# Patient Record
Sex: Female | Born: 1955 | Race: Black or African American | Hispanic: No | Marital: Married
Health system: Southern US, Community
[De-identification: ages and names within clinical notes are randomized; demographics above are authoritative.]

---

## 2005-10-01 ENCOUNTER — Ambulatory Visit: Payer: Self-pay | Admitting: Unknown Physician Specialty

## 2007-08-09 ENCOUNTER — Ambulatory Visit: Payer: Self-pay | Admitting: Gastroenterology

## 2007-08-18 ENCOUNTER — Ambulatory Visit: Payer: Self-pay | Admitting: Unknown Physician Specialty

## 2007-09-14 ENCOUNTER — Ambulatory Visit: Payer: Self-pay | Admitting: Surgery

## 2007-09-16 ENCOUNTER — Ambulatory Visit: Payer: Self-pay | Admitting: Surgery

## 2007-09-19 ENCOUNTER — Observation Stay: Payer: Self-pay | Admitting: Surgery

## 2007-09-21 ENCOUNTER — Inpatient Hospital Stay: Payer: Self-pay | Admitting: Surgery

## 2007-10-10 ENCOUNTER — Ambulatory Visit: Payer: Self-pay | Admitting: Internal Medicine

## 2007-11-07 ENCOUNTER — Ambulatory Visit: Payer: Self-pay | Admitting: Internal Medicine

## 2007-11-09 ENCOUNTER — Ambulatory Visit: Payer: Self-pay | Admitting: Internal Medicine

## 2007-12-10 ENCOUNTER — Ambulatory Visit: Payer: Self-pay | Admitting: Internal Medicine

## 2008-01-10 ENCOUNTER — Ambulatory Visit: Payer: Self-pay | Admitting: Internal Medicine

## 2008-02-09 ENCOUNTER — Ambulatory Visit: Payer: Self-pay | Admitting: Internal Medicine

## 2008-02-28 ENCOUNTER — Ambulatory Visit: Payer: Self-pay | Admitting: Internal Medicine

## 2008-03-11 ENCOUNTER — Ambulatory Visit: Payer: Self-pay | Admitting: Internal Medicine

## 2008-04-10 ENCOUNTER — Ambulatory Visit: Payer: Self-pay | Admitting: Internal Medicine

## 2008-05-10 ENCOUNTER — Ambulatory Visit: Payer: Self-pay | Admitting: Family Medicine

## 2008-05-11 ENCOUNTER — Ambulatory Visit: Payer: Self-pay | Admitting: Family Medicine

## 2008-05-11 ENCOUNTER — Ambulatory Visit: Payer: Self-pay | Admitting: Internal Medicine

## 2008-06-11 ENCOUNTER — Ambulatory Visit: Payer: Self-pay | Admitting: Internal Medicine

## 2008-06-11 ENCOUNTER — Ambulatory Visit: Payer: Self-pay | Admitting: Family Medicine

## 2008-07-09 ENCOUNTER — Ambulatory Visit: Payer: Self-pay | Admitting: Internal Medicine

## 2008-08-09 ENCOUNTER — Ambulatory Visit: Payer: Self-pay | Admitting: Internal Medicine

## 2008-08-21 ENCOUNTER — Ambulatory Visit: Payer: Self-pay | Admitting: Internal Medicine

## 2008-09-08 ENCOUNTER — Ambulatory Visit: Payer: Self-pay | Admitting: Internal Medicine

## 2008-11-07 ENCOUNTER — Ambulatory Visit: Payer: Self-pay | Admitting: Family Medicine

## 2009-01-19 ENCOUNTER — Emergency Department: Payer: Self-pay | Admitting: Internal Medicine

## 2009-01-28 ENCOUNTER — Ambulatory Visit: Payer: Self-pay | Admitting: Gastroenterology

## 2009-07-19 ENCOUNTER — Emergency Department: Payer: Self-pay | Admitting: Unknown Physician Specialty

## 2010-01-29 ENCOUNTER — Ambulatory Visit: Payer: Self-pay | Admitting: Unknown Physician Specialty

## 2010-10-29 ENCOUNTER — Ambulatory Visit: Payer: Self-pay | Admitting: Internal Medicine

## 2010-12-11 ENCOUNTER — Ambulatory Visit: Payer: Self-pay | Admitting: Gastroenterology

## 2010-12-15 LAB — PATHOLOGY REPORT

## 2011-02-25 ENCOUNTER — Emergency Department: Payer: Self-pay | Admitting: Unknown Physician Specialty

## 2011-07-29 ENCOUNTER — Ambulatory Visit: Payer: Self-pay | Admitting: Unknown Physician Specialty

## 2011-07-30 ENCOUNTER — Ambulatory Visit: Payer: Self-pay | Admitting: Unknown Physician Specialty

## 2012-02-18 ENCOUNTER — Ambulatory Visit: Payer: Self-pay | Admitting: General Surgery

## 2012-08-10 ENCOUNTER — Other Ambulatory Visit: Payer: Self-pay | Admitting: Podiatry

## 2012-08-10 ENCOUNTER — Inpatient Hospital Stay: Payer: Self-pay | Admitting: Podiatry

## 2012-08-10 LAB — CBC WITH DIFFERENTIAL/PLATELET
Basophil #: 0.1 10*3/uL (ref 0.0–0.1)
Basophil %: 0.8 %
Eosinophil #: 0.3 10*3/uL (ref 0.0–0.7)
Eosinophil %: 2 %
HCT: 30.1 % — ABNORMAL LOW (ref 35.0–47.0)
HGB: 9.7 g/dL — ABNORMAL LOW (ref 12.0–16.0)
Lymphocyte #: 1 10*3/uL (ref 1.0–3.6)
Lymphocyte %: 6.4 %
MCH: 32 pg (ref 26.0–34.0)
MCHC: 32.4 g/dL (ref 32.0–36.0)
MCV: 99 fL (ref 80–100)
Monocyte #: 0.9 x10 3/mm (ref 0.2–0.9)
Monocyte %: 5.9 %
Neutrophil #: 12.6 10*3/uL — ABNORMAL HIGH (ref 1.4–6.5)
Neutrophil %: 84.9 %
Platelet: 267 10*3/uL (ref 150–440)
RBC: 3.04 10*6/uL — ABNORMAL LOW (ref 3.80–5.20)
RDW: 14 % (ref 11.5–14.5)
WBC: 14.9 10*3/uL — ABNORMAL HIGH (ref 3.6–11.0)

## 2012-08-10 LAB — BASIC METABOLIC PANEL
Anion Gap: 12 (ref 7–16)
BUN: 19 mg/dL — ABNORMAL HIGH (ref 7–18)
Calcium, Total: 9.2 mg/dL (ref 8.5–10.1)
Chloride: 95 mmol/L — ABNORMAL LOW (ref 98–107)
Co2: 23 mmol/L (ref 21–32)
Creatinine: 4.26 mg/dL — ABNORMAL HIGH (ref 0.60–1.30)
EGFR (African American): 12 — ABNORMAL LOW
EGFR (Non-African Amer.): 11 — ABNORMAL LOW
Glucose: 367 mg/dL — ABNORMAL HIGH (ref 65–99)
Osmolality: 278 (ref 275–301)
Potassium: 3.9 mmol/L (ref 3.5–5.1)
Sodium: 130 mmol/L — ABNORMAL LOW (ref 136–145)

## 2012-08-11 LAB — CBC WITH DIFFERENTIAL/PLATELET
Basophil %: 1 %
Eosinophil #: 0.5 10*3/uL (ref 0.0–0.7)
Eosinophil %: 4.2 %
Lymphocyte #: 1.5 10*3/uL (ref 1.0–3.6)
Lymphocyte %: 12 %
MCHC: 32.4 g/dL (ref 32.0–36.0)
Monocyte #: 1 x10 3/mm — ABNORMAL HIGH (ref 0.2–0.9)
Neutrophil #: 9.1 10*3/uL — ABNORMAL HIGH (ref 1.4–6.5)
Neutrophil %: 74.3 %
Platelet: 290 10*3/uL (ref 150–440)
RBC: 3.05 10*6/uL — ABNORMAL LOW (ref 3.80–5.20)
RDW: 14.1 % (ref 11.5–14.5)
WBC: 12.3 10*3/uL — ABNORMAL HIGH (ref 3.6–11.0)

## 2012-08-11 LAB — COMPREHENSIVE METABOLIC PANEL
Albumin: 2.7 g/dL — ABNORMAL LOW (ref 3.4–5.0)
Anion Gap: 7 (ref 7–16)
Calcium, Total: 9.5 mg/dL (ref 8.5–10.1)
Chloride: 100 mmol/L (ref 98–107)
Creatinine: 5.89 mg/dL — ABNORMAL HIGH (ref 0.60–1.30)
EGFR (Non-African Amer.): 7 — ABNORMAL LOW
SGPT (ALT): 13 U/L (ref 12–78)
Sodium: 133 mmol/L — ABNORMAL LOW (ref 136–145)

## 2012-08-11 LAB — PHOSPHORUS: Phosphorus: 6.3 mg/dL — ABNORMAL HIGH (ref 2.5–4.9)

## 2012-08-12 LAB — PHOSPHORUS: Phosphorus: 6.8 mg/dL — ABNORMAL HIGH (ref 2.5–4.9)

## 2012-08-12 LAB — CBC WITH DIFFERENTIAL/PLATELET
Basophil #: 0.2 10*3/uL — ABNORMAL HIGH (ref 0.0–0.1)
Basophil %: 1.3 %
Eosinophil %: 3.8 %
HCT: 28.3 % — ABNORMAL LOW (ref 35.0–47.0)
HGB: 9.3 g/dL — ABNORMAL LOW (ref 12.0–16.0)
Lymphocyte #: 1.6 10*3/uL (ref 1.0–3.6)
Lymphocyte %: 10.9 %
MCH: 32.6 pg (ref 26.0–34.0)
MCHC: 32.8 g/dL (ref 32.0–36.0)
MCV: 99 fL (ref 80–100)
Monocyte #: 1.5 x10 3/mm — ABNORMAL HIGH (ref 0.2–0.9)
Neutrophil #: 10.6 10*3/uL — ABNORMAL HIGH (ref 1.4–6.5)
Neutrophil %: 73.8 %
Platelet: 306 10*3/uL (ref 150–440)
WBC: 14.3 10*3/uL — ABNORMAL HIGH (ref 3.6–11.0)

## 2012-08-12 LAB — BASIC METABOLIC PANEL
Calcium, Total: 9.3 mg/dL (ref 8.5–10.1)
Co2: 23 mmol/L (ref 21–32)
Creatinine: 8.54 mg/dL — ABNORMAL HIGH (ref 0.60–1.30)
EGFR (African American): 5 — ABNORMAL LOW
Potassium: 4.9 mmol/L (ref 3.5–5.1)

## 2012-08-13 LAB — CBC WITH DIFFERENTIAL/PLATELET
Basophil #: 0.2 10*3/uL — ABNORMAL HIGH (ref 0.0–0.1)
Basophil %: 1 %
Eosinophil #: 0.5 10*3/uL (ref 0.0–0.7)
Eosinophil %: 3.2 %
HCT: 28.2 % — ABNORMAL LOW (ref 35.0–47.0)
HGB: 9.2 g/dL — ABNORMAL LOW (ref 12.0–16.0)
Lymphocyte #: 1.4 10*3/uL (ref 1.0–3.6)
MCHC: 32.8 g/dL (ref 32.0–36.0)
Monocyte #: 1.8 x10 3/mm — ABNORMAL HIGH (ref 0.2–0.9)
Neutrophil #: 11.4 10*3/uL — ABNORMAL HIGH (ref 1.4–6.5)
Neutrophil %: 75 %
Platelet: 288 10*3/uL (ref 150–440)
RBC: 2.88 10*6/uL — ABNORMAL LOW (ref 3.80–5.20)
WBC: 15.2 10*3/uL — ABNORMAL HIGH (ref 3.6–11.0)

## 2012-08-14 LAB — CBC WITH DIFFERENTIAL/PLATELET
Basophil %: 1.2 %
Eosinophil #: 0.6 10*3/uL (ref 0.0–0.7)
HCT: 27.2 % — ABNORMAL LOW (ref 35.0–47.0)
HGB: 8.9 g/dL — ABNORMAL LOW (ref 12.0–16.0)
MCH: 32.4 pg (ref 26.0–34.0)
MCHC: 32.9 g/dL (ref 32.0–36.0)
Monocyte #: 1.4 x10 3/mm — ABNORMAL HIGH (ref 0.2–0.9)
Platelet: 306 10*3/uL (ref 150–440)
RDW: 13.9 % (ref 11.5–14.5)

## 2012-08-14 LAB — PHOSPHORUS: Phosphorus: 6.5 mg/dL — ABNORMAL HIGH (ref 2.5–4.9)

## 2012-08-15 LAB — CBC WITH DIFFERENTIAL/PLATELET
Basophil #: 0.1 10*3/uL (ref 0.0–0.1)
Eosinophil #: 0.5 10*3/uL (ref 0.0–0.7)
HCT: 27.3 % — ABNORMAL LOW (ref 35.0–47.0)
Lymphocyte #: 1.1 10*3/uL (ref 1.0–3.6)
MCHC: 32.7 g/dL (ref 32.0–36.0)
MCV: 98 fL (ref 80–100)
Monocyte #: 1.2 x10 3/mm — ABNORMAL HIGH (ref 0.2–0.9)
Monocyte %: 9.6 %
Neutrophil %: 75.7 %
RBC: 2.79 10*6/uL — ABNORMAL LOW (ref 3.80–5.20)
WBC: 12 10*3/uL — ABNORMAL HIGH (ref 3.6–11.0)

## 2012-08-15 LAB — PATHOLOGY REPORT

## 2012-08-16 LAB — CBC WITH DIFFERENTIAL/PLATELET
Basophil %: 1 %
Eosinophil #: 0.4 10*3/uL (ref 0.0–0.7)
Eosinophil %: 3.8 %
HCT: 27.3 % — ABNORMAL LOW (ref 35.0–47.0)
HGB: 9 g/dL — ABNORMAL LOW (ref 12.0–16.0)
Lymphocyte #: 0.8 10*3/uL — ABNORMAL LOW (ref 1.0–3.6)
MCHC: 33 g/dL (ref 32.0–36.0)
MCV: 98 fL (ref 80–100)
Monocyte #: 1.1 x10 3/mm — ABNORMAL HIGH (ref 0.2–0.9)
Monocyte %: 11.7 %
Platelet: 322 10*3/uL (ref 150–440)
RBC: 2.79 10*6/uL — ABNORMAL LOW (ref 3.80–5.20)
RDW: 13.9 % (ref 11.5–14.5)

## 2012-08-16 LAB — WOUND CULTURE

## 2012-08-16 LAB — CULTURE, BLOOD (SINGLE)

## 2012-08-18 LAB — MISC AER/ANAEROBIC CULT.

## 2012-08-30 LAB — COMPREHENSIVE METABOLIC PANEL
Albumin: 2.4 g/dL — ABNORMAL LOW (ref 3.4–5.0)
Alkaline Phosphatase: 90 U/L (ref 50–136)
BUN: 35 mg/dL — ABNORMAL HIGH (ref 7–18)
Bilirubin,Total: 0.3 mg/dL (ref 0.2–1.0)
Calcium, Total: 9.4 mg/dL (ref 8.5–10.1)
Co2: 27 mmol/L (ref 21–32)
Creatinine: 8.59 mg/dL — ABNORMAL HIGH (ref 0.60–1.30)
EGFR (African American): 5 — ABNORMAL LOW
EGFR (Non-African Amer.): 5 — ABNORMAL LOW
Glucose: 124 mg/dL — ABNORMAL HIGH (ref 65–99)
Potassium: 4.6 mmol/L (ref 3.5–5.1)
SGPT (ALT): 13 U/L (ref 12–78)

## 2012-08-30 LAB — BASIC METABOLIC PANEL
BUN: 24 mg/dL — ABNORMAL HIGH (ref 7–18)
Calcium, Total: 9.2 mg/dL (ref 8.5–10.1)
Chloride: 96 mmol/L — ABNORMAL LOW (ref 98–107)
EGFR (African American): 8 — ABNORMAL LOW
Glucose: 180 mg/dL — ABNORMAL HIGH (ref 65–99)
Sodium: 132 mmol/L — ABNORMAL LOW (ref 136–145)

## 2012-08-30 LAB — RENAL FUNCTION PANEL
Albumin: 2.4 g/dL — ABNORMAL LOW (ref 3.4–5.0)
Anion Gap: 13 (ref 7–16)
Chloride: 90 mmol/L — ABNORMAL LOW (ref 98–107)
Co2: 25 mmol/L (ref 21–32)
Phosphorus: 7.5 mg/dL — ABNORMAL HIGH (ref 2.5–4.9)
Potassium: 4.8 mmol/L (ref 3.5–5.1)
Sodium: 128 mmol/L — ABNORMAL LOW (ref 136–145)

## 2012-12-07 IMAGING — US ULTRASOUND RIGHT BREAST
1 series · 17 of 25 positions shown · non-contrast
Comparison: none

REASON FOR EXAM: nodule
COMMENTS:

PROCEDURE:     US  - US BREAST RIGHT  - July 30, 2011  [DATE]
RESULT:

[Series 1: ultrasound right breast · 17 of 45 slices shown]
[im 1/45]
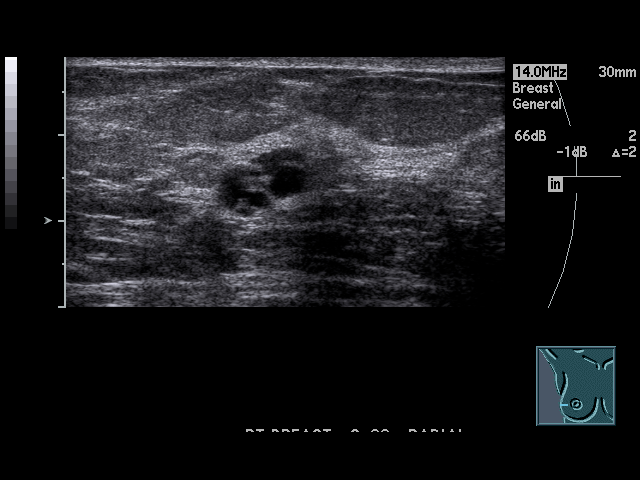
[im 4/45]
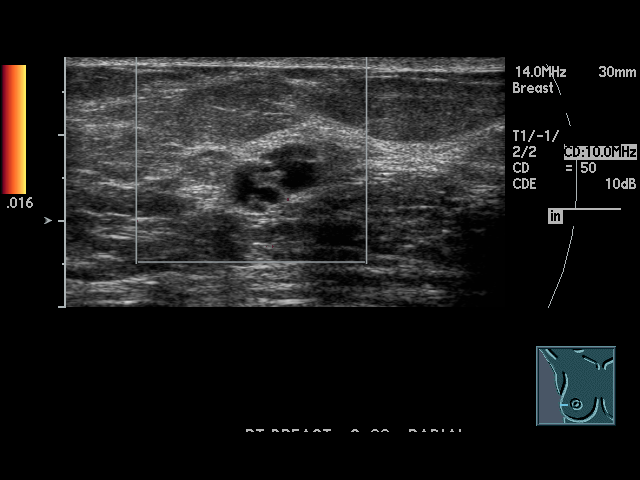
[im 6/45]
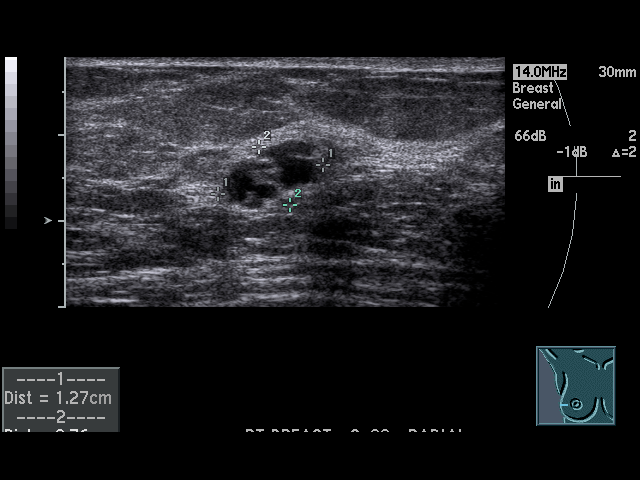
[im 10/45]
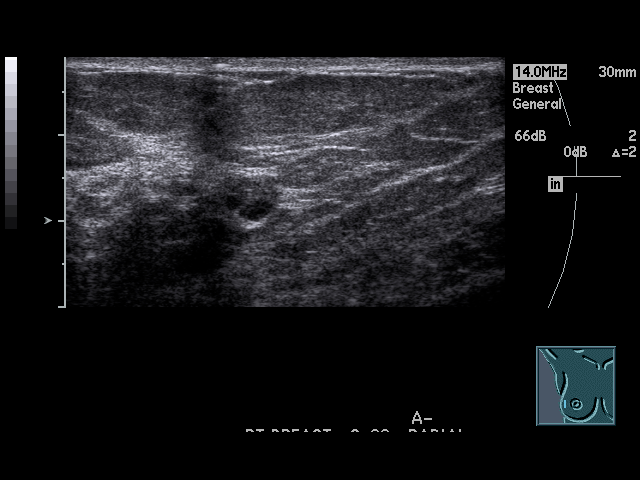
[im 12/45]
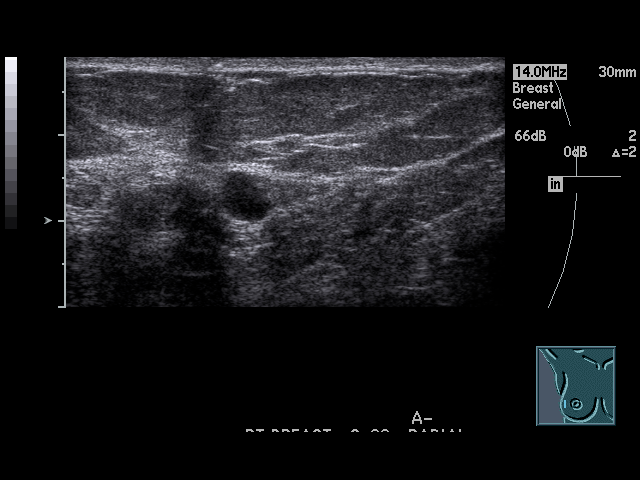
[im 15/45]
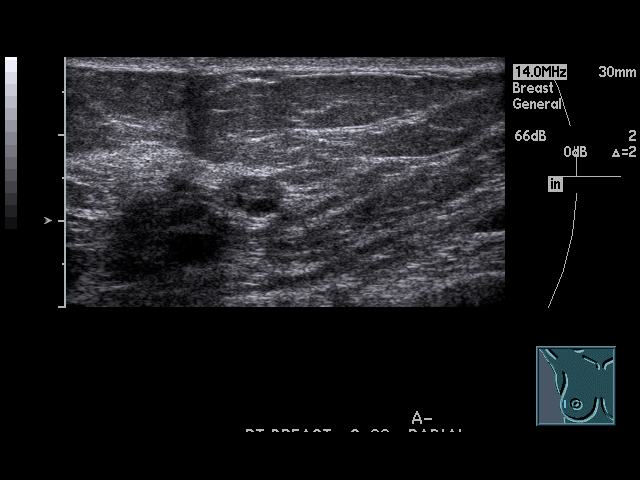
[im 17/45]
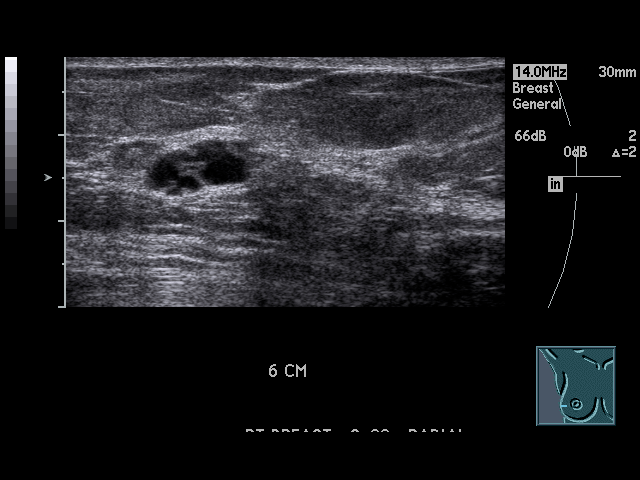
[im 21/45]
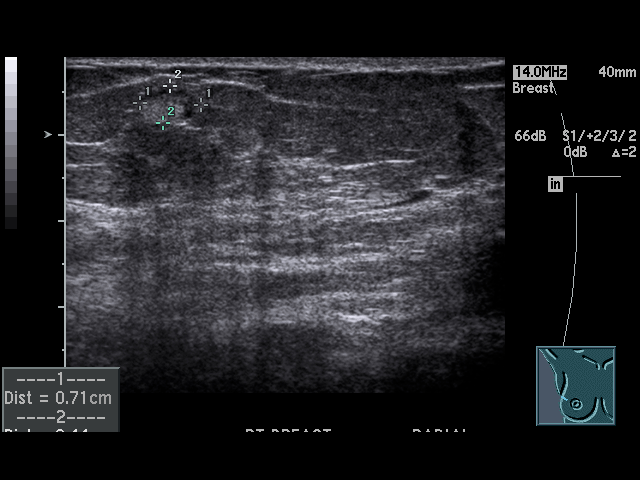
[im 23/45]
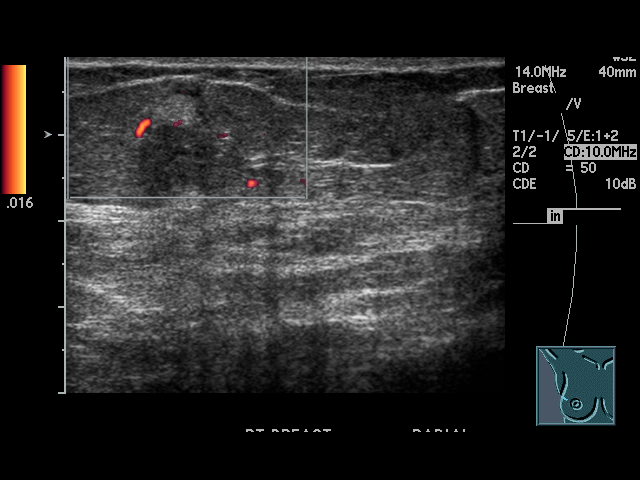
[im 24/45]
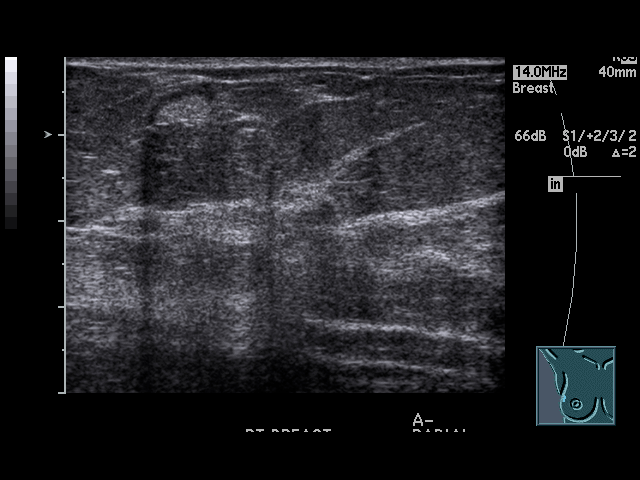
[im 28/45]
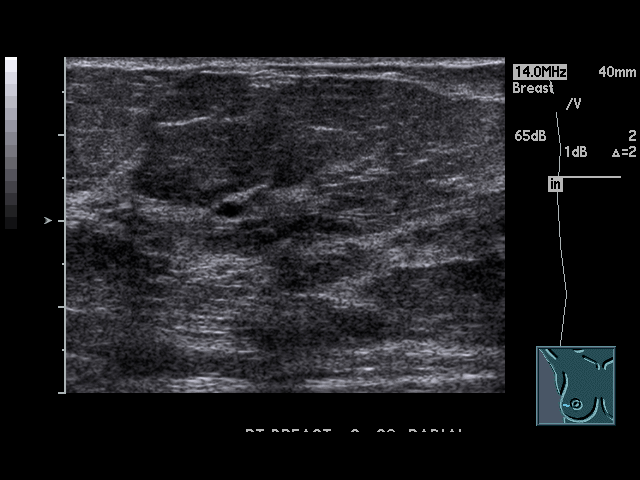
[im 30/45]
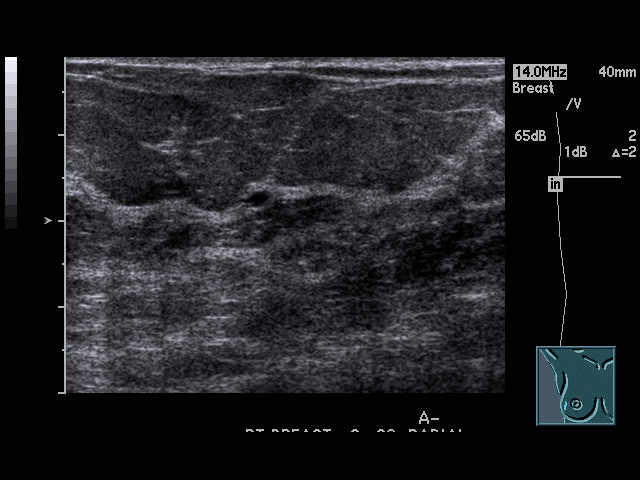
[im 34/45]
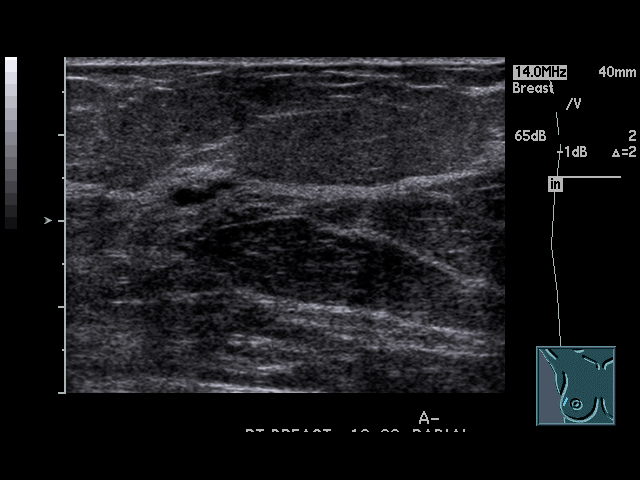
[im 35/45]
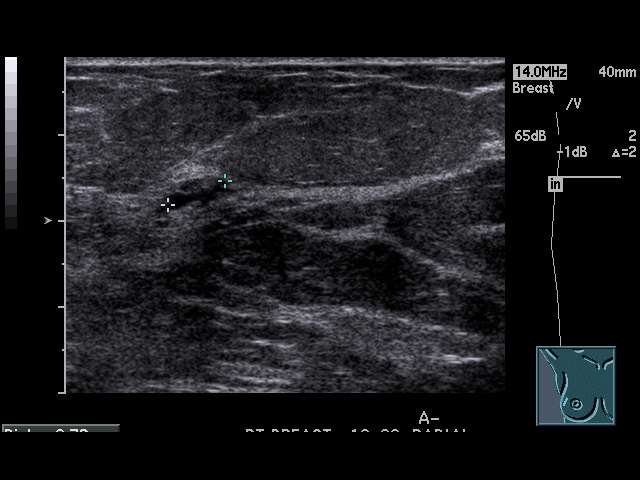
[im 39/45]
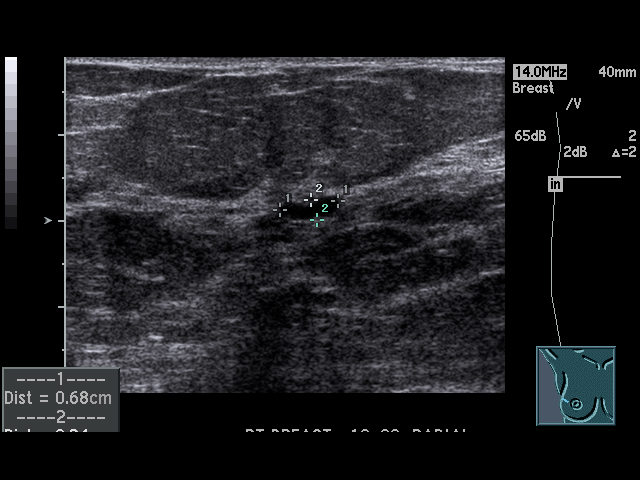
[im 41/45]
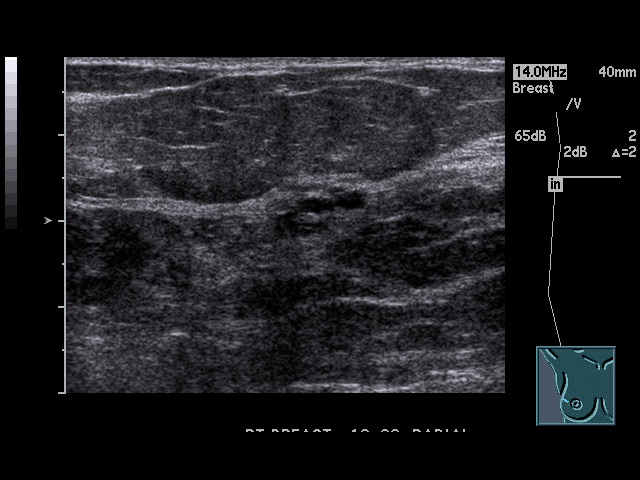
[im 45/45]
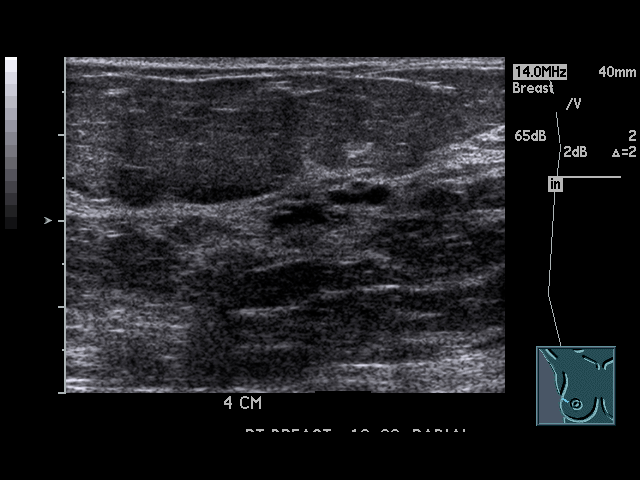

[17 of 25 positions shown; findings below may reference images not displayed]

FINDINGS: The patient returned for additional magnification compression
images of the right breast in the area of concern. The somewhat elliptical
parenchymal density near the 9 to 10 o'clock region of the right breast
posteriorly persists. No density additionally is seen in this area. No
malignant appearing calcifications are evident. Targeted ultrasound in the
region demonstrates multiple cysts versus a septated cyst with an overall
measurement of 1.3 x 0.8 x 0.7 cm. The 10 o'clock area shows a solid
appearing, elliptical structure with increased central echogenicity
suggestive of a lymph node measuring 0.7 x 0.4 x 0.9 cm. The [DATE] region
shows a 0.3 x 0.2 x 0.2 cm cyst. The 10 o'clock region shows multiple cysts
versus septated cysts measuring 0.7 x 0.4 x 0.7 cm.
IMPRESSION: Complex, cystic structure in the 9 o'clock region with a
smaller similar structure in the 10 o'clock region which could represent a
complex, septated cyst with some soft tissue component or adjacent cysts. No
definite malignant features are seen. The area mammographically appears to
be slightly more prominent than anything in this region previously.

BI-RADS: Category 4A - Finding needing intervention with a low suspicion for
malignancy.

RECOMMENDATION:  Six month follow-up view at minimum is recommended if no
surgical intervention is obtained.

## 2012-12-09 DEATH — deceased

## 2014-08-31 NOTE — Consult Note (Signed)
PATIENT NAME:  Lauren Mcintosh, Lauren Mcintosh MR#:  [id number] DATE OF BIRTH:  [birth year]  DATE OF CONSULTATION:  08/10/2012  REQUESTING PHYSICIAN:  Dr. Ether GriffinsFowler   CONSULTING PHYSICIAN:  Giovanni Biby Lizabeth LeydenN. Airyanna Dipalma, MD  REASON FOR CONSULTATION:  Evaluation and management of end-stage renal disease in a hemodialysis patient.   HISTORY OF PRESENT ILLNESS: The patient is a very pleasant 59 year old African American female with past medical history of diabetes mellitus, hypertension, diabetic nephropathy, end-stage renal disease on hemodialysis Monday, Wednesday and Friday, Barrett's esophagus, right-sided colectomy for high-grade tubular adenoma in May 2009, anemia of chronic kidney disease, secondary hyperparathyroidism, diverticulosis, right foot ulceration who presented to [facility] for further care of right diabetic foot ulcer. The patient was followed by Dr. Gwyneth RevelsJustin Fowler for this issue. She will need additional antibiotic and surgical therapy to fully treat these ulcerations. In regards to her underlying endstage renal disease, the patient has been on dialysis for the past 3.5 years. She [facility]. The cause of her renal failure is diabetic nephropathy. She dialyzes on the first shift per her report. She has secondary hyperparathyroidism for which she takes Renvela 4 tablets with meals and 2 tablets with snacks. She also has anemia of chronic kidney disease treated with Epogen, most recent hemoglobin was found to be 9.7. The patient has a left upper extremity fistula, which is functional. She underwent dialysis today and completed her full therapy.   PAST MEDICAL HISTORY: 1.  Diabetes mellitus.  2.  Hypertension.  3.  Diabetic nephropathy.  4. End-stage renal disease on hemodialysis Monday, Wednesday, Friday [facility].  5.  History of Barrett's esophagus.  6.  Right hemicolectomy for high-grade tubular adenoma in May 2009.  7.   Anemia of chronic kidney disease.  8.  Secondary hyperparathyroidism.  9.  Diverticulosis.  10.  Left upper extremity AV fistula.   ALLERGIES:  No known drug allergies.  CURRENT INPATIENT MEDICATIONS:  Include: 1.  Tylox 5/325 one to 2 tablets p.o. q. 4 hours p.r.n. pain.  2.  Sliding scale insulin.  3.  Toprol-XL 50 mg p.o. daily.  4.  Prilosec 20 mg p.o. b.i.d.  5.  Actos 30 mg p.o. daily.  6.  Zosyn 3.375 grams IV q. 12 hours.  7.  Pravastatin 40 mg p.o. at bedtime.  8.  Sevelamer 3200 mg p.o. t.i.d. with meals.   SOCIAL HISTORY:  The patient lives in [place]. She has 1 daughter. She denies tobacco, alcohol or illicit drug use.   FAMILY HISTORY:  The patient's mother is living and has hypertension. The patient's father is deceased and had history of end-stage renal disease requiring hemodialysis.  REVIEW OF SYSTEMS:  CONSTITUTIONAL:  Denies fevers. Reports some intermittent chills.  EYES:  Denies diplopia, blurry vision.  HEENT:  Denies headaches or hearing loss. Denies epistaxis.  CARDIOVASCULAR:  Denies chest pain, palpitations, PND.  RESPIRATORY:  Denies cough, shortness of breath, hemoptysis.  GASTROINTESTINAL:  Denies nausea, vomiting, bloody stools.  GENITOURINARY:  Denies frequency, urgency, dysuria.  MUSCULOSKELETAL:  Reports right foot diabetic foot ulcer.  INTEGUMENTARY:  Denies skin rashes. Reports ulceration in the right foot.  NEUROLOGIC:  Reports numbness and tingling in bilateral feet.  PSYCHIATRIC:  Denies depression, bipolar disorder.  ENDOCRINE: Denies polyuria or polydipsia. Does have history of diabetes mellitus. HEMATOLOGIC AND LYMPHATIC: Denies easy bruisability, bleeding or swollen lymph nodes. Has history of anemia of chronic kidney disease. ALLERGY AND IMMUNOLOGIC:  Denies seasonal allergies or history of immunodeficiency.   PHYSICAL EXAMINATION: VITAL SIGNS: Temperature 99.9, pulse 97, respiration 18, blood pressure 127/74, pulse oximetry 98%  on room air.  GENERAL:  Well-developed, well-nourished African American female who appears her stated age, currently in no acute distress.  HEENT:  Normocephalic, atraumatic. Extraocular movements are intact. Pupils equal, round and reactive to light. No scleral icterus. Conjunctivae are pink. No epistaxis noted. Gross hearing intact. Oral mucosa moist.  NECK:  Supple without JVD or lymphadenopathy.  LUNGS:  Clear to auscultation bilaterally with normal respiratory effort.  HEART:  S1, S2 regular rate and rhythm. No murmurs, rubs or gallops appreciated.  ABDOMEN:  Obese, soft, nontender, nondistended. Bowel sounds positive.  No rebound.  No guarding. No gross organomegaly appreciated.  EXTREMITIES:  No clubbing, cyanosis or edema.  NEUROLOGIC: The patient is alert and oriented to time, person and place. Strength is 5/5 in both upper and lower extremities.  SKIN:  Warm and dry. No rashes noted.  MUSCULOSKELETAL:  The patient's right foot is wrapped. The dressing was not taken down as wounds were evaluated in the right foot by Dr. Ether Griffins today.  PSYCHIATRIC:  The patient has appropriate affect and appears to have good insight into her current illness. HEMODIALYSIS ACCESS:  The patient has a left upper extremity fistula with good bruit and thrill.   LABORATORY DATA:  Sodium 130, potassium 3.9, chloride 95, CO2 23, BUN 19, creatinine 4.26, glucose 367. CBC shows WBC 14.9, hemoglobin 9.7, hematocrit 30, platelets 267.   IMPRESSION:  This is a 59 year old African American female with past medical history of diabetes mellitus, hypertension, diabetic nephropathy, end-stage renal disease on hemodialysis Monday, Wednesday, Friday followed by [facility] at [place], right colectomy for high-grade tubular adenoma May 2009, anemia of chronic disease, secondary hyperparathyroidism, diverticulosis who presented to [facility] with diabetic ulcer in the right foot.    PLAN: 1. End-stage renal disease on hemodialysis Monday, Wednesday, Friday. The patient is followed by [facility] at [facility]. She has been undergoing dialysis quite well. She tolerated her dialysis session today. Therefore, at this point in time, no indication for dialysis tomorrow. We will plan for the next dialysis treatment on Friday. We will continue to use her left upper extremity AV fistula.  2. Right diabetic foot ulcer. The patient is being monitored by Dr. Ether Griffins for this. The patient has been started on Zosyn 3.375 grams IV q. 12 hours. We would also recommend consideration of MRSA coverage. Therefore, vancomycin could be considered; however, I defer this to Dr. Ether Griffins.  3. Secondary hyperparathyroidism.  The patient reports to me that she takes 3200 mg of Renvela t.i.d. with meals. However, she also takes 2400 mg with snacks. We will add this to her medication regimen.  4. Anemia of chronic kidney disease.  The patient's hemoglobin is a bit low at present at 9.7. She may be slightly Epogen resistant given the acute infection now. However, we will continue the patient on Epogen 10,000 units IV every dialysis treatment. We will determine her outpatient Epogen dosing as well. 5. I would like to thank Dr. Ether Griffins for this kind referral. Further plans as the patient progresses.     ____________________________ Lennox Pippins, MD mnl:ce D: 08/10/2012 17:07:09 ET T: 08/10/2012 17:27:08 ET JOB#: [id number]  cc: Lennox Pippins, MD, <Dictator>

## 2014-08-31 NOTE — Consult Note (Signed)
PATIENT NAME:  Lauren Mcintosh, Lauren Mcintosh MR#:  [id number] DATE OF BIRTH:  07-22-1955  DATE OF CONSULTATION:  08/12/2012  REFERRING PHYSICIAN:  Gwyneth Revels, DPM CONSULTING PHYSICIAN:  Rosalyn Gess. Kennidee Heyne, MD  REASON FOR CONSULTATION: Right foot infection.   HISTORY OF PRESENT ILLNESS: The patient is a 59 year old female with a past history significant for diabetes and end-stage renal disease on hemodialysis who was admitted on 04/02 with fairly new onset ulceration with swelling and redness of the right foot. The patient had had ulcers in the right foot in the past but they had healed. Her most recent ulceration was over the past summer. The  patient noted some increased swelling in the right foot, and her mother noticed a draining wound on the bottom of her foot. The patient was seen and evaluated and ultimately admitted. She underwent surgery on 04/03, and the intraoperative note indicates that she had areas of infection in the soft tissue including the sesamoid bone, but the metatarsal bone appeared to be intact. The wound was debrided and antibiotic beads were placed. She tolerated the procedure well. Currently, she is complaining of some pain in the foot mainly on the dorsal aspect. She had not had significant fevers, chills, or sweats prior to her admission. She had some redness and some swelling of the foot but no streaking up her leg. She did not have significant pain in the foot prior to surgery but did have some discomfort that she attributed to the swelling. She is currently on vancomycin and Zosyn. Intraoperative cultures are pending   ALLERGIES: None.   PAST MEDICAL HISTORY: 1. Diabetes.  2. End-stage renal disease on hemodialysis.  3. 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  4. Hypertension.  5. Hypercholesterolemia.  6. Tubular adenoma of the cecum, status post colectomy.  7. Diverticulosis.  8. Gastritis.  9. Esophagitis.  10. Vitamin D deficiency.   SOCIAL HISTORY: The  patient lives with her husband. She does not smoke. She does not drink. She has an outside dog at home.   FAMILY HISTORY: Positive for diabetes and congestive heart failure.   REVIEW OF SYSTEMS:   GENERAL: No fevers, chills, sweats or malaise.  HEENT: No headaches or sinus congestion.  CHEST: No cough. No shortness of breath. No sputum production.  CARDIAC: No chest pains or palpitations.  GI: No nausea. No vomiting. No abdominal pain. No change in her bowels.  GENITOURINARY: She was anuric.   MUSCULOSKELETAL: She had swelling and redness and some discomfort in the right foot.  No other joints have been bothering her.  SKIN: She had the ulceration that has been draining purulent material for several days prior to admission.  NEUROLOGIC: No focal weakness.  PSYCHIATRIC: No complaints.  All other systems are negative.   PHYSICAL EXAMINATION: VITAL SIGNS: T-max of 101.0, T-current of 97.7, pulse of 100, blood pressure 132/75, 91% on room air.  GENERAL: A 59 year old black female in no acute distress.  HEENT: Normocephalic, atraumatic. Pupils are equal and reactive to light. Extraocular motions  intact. Sclerae, conjunctivae and lids appeared to be without evidence for emboli or petechiae.  CHEST: Clear to auscultation bilaterally with good air movement. No focal consolidation.  HEART: Regular rate and rhythm without murmur, rub or gallop.  ABDOMEN: Soft, nontender and nondistended. No hepatosplenomegaly. No hernia is noted.  EXTREMITIES: No evidence for tenosynovitis. The right foot was wrapped in bandages, and the wound was not directly observed. There is no evidence for lymphangitic streaking up the right leg.  She had her hemodialysis access in her left arm.  SKIN: No rashes. No stigmata of endocarditis, specifically, no Janeway lesions or Osler nodes.  NEUROLOGIC: The patient was awake and interactive, moving all 4 extremities.  PSYCHIATRIC: Mood and affect appeared normal.   LABORATORY  DATA: BUN 42, creatinine 8.54, bicarbonate 23, anion gap 11. LFTs were unremarkable. White count from today was 14.3 with a hemoglobin 9.3, platelet count of 306, ANC of 10.6. White count on admission was 14.9. Wound culture from admission, presumably a superficial culture, is growing several gram-negative rods and gram-positive cocci. Blood cultures from admission are negative to date. A wound culture from yesterday that was taken intraoperatively is also growing gram-negative rods and a Gram-positive cocci.   IMPRESSION: A 59 year old female with a past history significant for diabetes and end-stage renal disease on hemodialysis who was admitted with a right foot abscess.   RECOMMENDATIONS: 1. The intraoperative note indicates that the metatarsal bone was intact and without evidence for osteomyelitis. The sesamoid bone was involved and removed, but as this bone is not articulated with the bones, infection of the sesamoid may not have the same issues as the other bones of the foot.  2. I would continue current broad-spectrum antibiotics until the cultures return.  3. We will adjust the antibiotics after the cultures return.  4. We will follow her CBC.   This is a low level infectious disease consult.   Thank you very much for involving me in Ms. Urquilla's care.   ____________________________ Rosalyn GessMichael E. Nailyn Dearinger, MD meb:cb D: 08/12/2012 16:00:02 ET T: 08/12/2012 17:02:55 ET JOB#: [id number]  cc: Rosalyn GessMichael E. Tilford Deaton, MD, <Dictator> Keoni Havey E Jemima Petko MD ELECTRONICALLY SIGNED 08/13/2012 10:30

## 2014-08-31 NOTE — Consult Note (Signed)
PATIENT NAME:  Lauren Mcintosh, Lauren Mcintosh MR#:  [id number] DATE OF BIRTH:  25-Aug-1955  DATE OF CONSULTATION:  08/10/2012  REFERRING PHYSICIAN:  Jill Alexanders A. Ether Griffins, DPM CONSULTING PHYSICIAN:  Duane Lope. Judithann Sheen, MD  REASON FOR CONSULTATION: Medical management of hypertension, diabetes and end-stage renal disease.   HISTORY OF PRESENT ILLNESS: The patient is a 59 year old female with a history of end-stage renal disease on hemodialysis every Monday, Wednesday, Friday, as well as diabetes, hypertension and hyperlipidemia. Admitted with right foot ulceration for surgery tomorrow. The patient has been having progressive pain and ulceration involving the right lower extremity. She was admitted by podiatry. Currently she denies any fevers, chills, nausea, vomiting, chest pain, shortness of breath or palpitations.   PAST MEDICAL HISTORY:  1.  Type 2 diabetes mellitus.  2.  Diabetic nephropathy.  3.  Diabetic retinopathy.  4.  Essential hypertension.  5.  Hyperlipidemia.  6.  End-stage renal disease on hemodialysis.  7.  Anemia of chronic disease.  8.  History of tubular adenomas of the cecum, status post partial colectomy.  9.  Diverticulosis.  10.  History of gastritis.  11.  History of esophagitis.  12.  Vitamin D deficiency.  13.  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   MEDICATIONS:  1.  Actos 30 mg p.o. daily.  2.  Aspirin 81 mg p.o. daily.  3.  Glipizide 5 mg p.o. t.i.d.   4.  Multivitamin 1 p.o. daily.  5.  Pravastatin 40 mg p.o. daily.  6.  Nexium 40 mg p.o. daily.  7.  Renvela 3200 mg p.o. t.i.d.  8.  Rena-Vite 1 p.o. daily.   ALLERGIES: No known drug allergies.   SOCIAL HISTORY: Negative for alcohol or tobacco abuse.   FAMILY HISTORY: Positive for diabetes and congestive heart failure.   REVIEW OF SYSTEMS:    CONSTITUTIONAL: No fever or change in weight.  EYES: No blurred or double vision. No glaucoma.  ENT: No tinnitus or hearing loss. No nasal discharge or bleeding. No difficulty  swallowing.  RESPIRATORY: No cough or wheezing. Denies hemoptysis. No painful respiration.  CARDIOVASCULAR: No chest pain or orthopnea. No palpitations or syncope.  GASTROINTESTINAL: No nausea, vomiting or diarrhea. No abdominal pain. No change in bowel habits.  GENITOURINARY: No dysuria or hematuria. No incontinence.  ENDOCRINE: No polyuria or polydipsia. No heat or cold intolerance.  HEMATOLOGIC: The patient denies bleeding or bruising. Does have chronic anemia.  LYMPHATIC: No swollen glands.  MUSCULOSKELETAL: The patient denies pain in her neck, back, shoulders, knees or hips. No gout.  NEUROLOGIC: The patient does have numbness and tingling of her hands and feet. Denies weakness. No migraines, stroke or seizures.  PSYCHIATRIC: The patient denies anxiety, insomnia or depression.   PHYSICAL EXAMINATION:  GENERAL: The patient is in no acute distress.  VITAL SIGNS: Remarkable for a blood pressure of 127/74 with a heart rate of 97 and a temperature of 99.9 with a respiratory rate of 18. Sats are 98% on room air.  HEENT: Normocephalic, atraumatic. Pupils equally round and reactive to light and accommodation. Extraocular movements are intact. Sclerae are anicteric. Conjunctivae are clear. Oropharynx is clear.  NECK: Supple without JVD. No adenopathy or thyromegaly is noted.  LUNGS: Clear to auscultation and percussion without wheezes, rales or rhonchi. No dullness.  CARDIAC: Regular rate and rhythm. Normal S1, S2. No significant rubs, murmurs or gallops. PMI is nondisplaced. Chest wall is nontender.  ABDOMEN: Soft, nontender, with normoactive bowel sounds. No organomegaly or masses were appreciated. No hernias  or bruits were noted.  EXTREMITIES: Trace edema on the right. None on the left. Distal pulses were 1+ bilaterally.  SKIN: Warm and dry without rash. Ulceration of the right foot was noted.  NEUROLOGIC: Cranial nerves II through XII grossly intact. Deep tendon reflexes were symmetric. Motor  exam was nonfocal. Sensory exam revealed a stocking glove neuropathy.  PSYCHIATRIC: Revealed a patient who was alert and oriented to person, place and time. She was cooperative and used good judgment.   ASSESSMENT:  1.  End-stage renal disease on hemodialysis.  2.  Type 2 diabetes.  3.  Diabetic nephropathy.  4.  Diabetic retinopathy.  5.  Diabetic neuropathy.  6.  Hyperlipidemia.  7.  Essential hypertension.  8.  Anemia of chronic disease.  9.  History of colonic polyps, status post partial colectomy.  10.  History of gastritis and esophagitis.   PLAN: The patient states that she was taken off her blood pressure medications. She also states that she is not on insulin. We will hold her glipizide at this time, as she is not eating a normal diet. We will continue her Actos and follow her sugars with Accu-Cheks before meals and at bedtime and add sliding scale insulin as needed. We will begin metoprolol empirically perioperatively and follow her heart rate and blood pressure closely. Agree with nephrology consultation for continuation of hemodialysis. Will obtain baseline labs and follow up labs postoperatively.   Thank you for the consultation. Will continue to follow this patient with you while in the hospital. Please call if questions arise.   ____________________________ Duane LopeJeffrey D. Judithann SheenSparks, MD jds:jm D: 08/10/2012 15:59:31 ET T: 08/10/2012 16:54:51 ET JOB#: [id number]  cc: Duane LopeJeffrey D. Judithann SheenSparks, MD, <Dictator> Shelvie Salsberry Rodena Medin Yittel Emrich MD ELECTRONICALLY SIGNED 08/11/2012 7:54

## 2014-08-31 NOTE — Op Note (Signed)
PATIENT NAME:  Lauren Mcintosh, [place] MR#:  [id number] DATE OF BIRTH:  06-08-1955  DATE OF SURGERY:  08/11/2012  PREOPERATIVE DIAGNOSIS: Right first metatarsophalangeal joint ulcer with infection.   POSTOPERATIVE DIAGNOSIS: Infected right first metatarsophalangeal joint ulcer with abscess.   PROCEDURES: 1. Excisional debridement, plantar diabetic foot ulcer, and excision of sesamoid tarsal bone plantar first metatarsophalangeal joint.  2.  Implantation of Stimulan antibiotic-impregnated beads.   ANESTHESIA: General/LMA.   HEMOSTASIS: None.   COMPLICATIONS: None.   SPECIMENS:  1.  Ulcer, plantar right foot diabetic ulcer.  2.  Sesamoid from diabetic foot ulcer, right foot.   OPERATIVE INDICATIONS: This is a 59 year old female admitted to the hospital with an abscessed draining ulcer from her right foot. She has a plantar ulcer with a medial abscessed area to her first MTPJ that was connected. I discussed surgical intervention and the need for I and D, and the risks, benefits, alternatives, and complications associated with surgery were discussed with the patient and informed consent has been given.   OPERATIVE PROCEDURE: The patient was brought into the OR and placed on the operating table in the supine position. General LMA was administered by the anesthesia team. The right lower extremity was then prepped and draped in the usual sterile fashion. After sterile prep and drape a medial incision was made overlying the abscessed area. Most of the purulent drainage had been drained out from the puncture of the blistered area yesterday. There was some noted deeper necrotic, nonviable tissue to this area that had been destroyed from the infectious process. This wound was then opened up proximal and distal. It was dissected out dorsally and plantarly. There was a connection of a plantar 1 cm ulceration in this area.  The medial aspect was then debrided down to deep capsular tissue with a Versajet. This was an  excisional type of debridement. The plantar ulceration was then debrided with a 15-blade and excised of surrounding soft tissue with a full-thickness debridement down to bone. At this time the plantar first MTPJ had a noted sesamoid area that was connected to the ulcerative site, and I felt this to be a causative agent in allowing this ulceration to stay open. I then excised the sesamoid at this time. This wound was then further debrided and flushed with the Versajet. The plantar aspect of the wound was then packed with multiple antibiotic-impregnated beads. These were impregnated with 1 gram of vancomycin and approximately 0.25 gram of tobramycin. This was placed into the deeper soft tissues plantarly and just proximal to the metatarsal head. The metatarsal head was evaluated. It did not have any obvious erosive changes and it appeared to be quite viable at this time. Soft tissue from the ulcer was sent for pathology, as well as the sesamoid was sent for pathology. Next, the proximal and distal incision site to the medial wound was then closed with the nylon. The plantar aspect was loosely closed to allow the antibiotic beads to take effect. This was closed with 4-0 Vicryl deeply in the deep layers. A small retention suture was placed superficially. A well-compressive large, sterile bulky dressing was placed on the right foot and ankle. She tolerated the procedure and anesthesia well and was transported from the OR to the PACU with all vital signs stable and vascular status intact. She will be readmitted to the floor, continued with IV antibiotics. We will await the wound culture that was taken intraoperatively and can possibly receive IV antibiotics at dialysis long-term.  ____________________________ Argentina Donovan Ether Griffins, DPM jaf:dm D: 08/11/2012 13:45:00 ET T: 08/11/2012 14:12:50 ET JOB#: [id number]  cc: Jill Alexanders A. Ether Griffins, DPM, <Dictator> Jonesha Tsuchiya DPM ELECTRONICALLY SIGNED 08/19/2012 16:57

## 2014-08-31 NOTE — Consult Note (Signed)
Chief Complaint:  Subjective/Chief Complaint Pt with ESRD on HD and DM admitted for foot surgery. Denies CP or SOB.   VITAL SIGNS/ANCILLARY NOTES: **Vital Signs.:   02-Apr-14 14:16  Temperature Temperature (F) 99.9  Pulse Pulse 97  Systolic BP Systolic BP 127  Diastolic BP (mmHg) Diastolic BP (mmHg) 74  Pulse Ox % Pulse Ox % 98  Oxygen Delivery Room Air/ 21 %   Brief Assessment:  Cardiac Regular   Respiratory clear BS   Gastrointestinal details normal Soft  Nontender  Bowel sounds normal   Assessment/Plan:  Assessment/Plan:  Plan Will follow sugars and BP closely. Hold Glipizide until pt eating. Agree with Nephrology consult. Will follow with you.   Electronic Signatures: Marguarite ArbourSparks, Jessamine Barcia D (MD)  (Signed 02-Apr-14 15:53)  Authored: Chief Complaint, VITAL SIGNS/ANCILLARY NOTES, Brief Assessment, Assessment/Plan   Last Updated: 02-Apr-14 15:53 by Marguarite ArbourSparks, Jilliane Kazanjian D (MD)

## 2014-08-31 NOTE — Discharge Summary (Signed)
Dates of Admission and Diagnosis:  Date of Admission 10-Aug-2012   Date of Discharge 16-Aug-2012   Admitting Diagnosis osteomyelitis right foot   Final Diagnosis same   Discharge Diagnosis 1 Diabetes    Chief Complaint/History of Present Illness Pt admitted by Dr. Vickki Muff for right foot infection secondary to diabetes and ulceration.  Had likely osteo in right tibial sesamoiod.   Routine Chem:  02-Apr-14 15:22   Glucose, Serum  367  BUN  19  Creatinine (comp)  4.26  Sodium, Serum  130  eGFR (African American)  12  eGFR (Non-African American)  11 (eGFR values <28m/min/1.73 m2 may be an indication of chronic kidney disease (CKD). Calculated eGFR is useful in patients with stable renal function. The eGFR calculation will not be reliable in acutely ill patients when serum creatinine is changing rapidly. It is not useful in  patients on dialysis. The eGFR calculation may not be applicable to patients at the low and high extremes of body sizes, pregnant women, and vegetarians.)  03-Apr-14 05:09   Glucose, Serum  176  BUN  27  Creatinine (comp)  5.89  Sodium, Serum  133  eGFR (African American)  8  eGFR (Non-African American)  7 (eGFR values <653mmin/1.73 m2 may be an indication of chronic kidney disease (CKD). Calculated eGFR is useful in patients with stable renal function. The eGFR calculation will not be reliable in acutely ill patients when serum creatinine is changing rapidly. It is not useful in  patients on dialysis. The eGFR calculation may not be applicable to patients at the low and high extremes of body sizes, pregnant women, and vegetarians.)  04-Apr-14 05:22   Glucose, Serum  147  BUN  42  Creatinine (comp)  8.54  Sodium, Serum  134  eGFR (African American)  5  eGFR (Non-African American)  5 (eGFR values <6093min/1.73 m2 may be an indication of chronic kidney disease (CKD). Calculated eGFR is useful in patients with stable renal function. The eGFR  calculation will not be reliable in acutely ill patients when serum creatinine is changing rapidly. It is not useful in  patients on dialysis. The eGFR calculation may not be applicable to patients at the low and high extremes of body sizes, pregnant women, and vegetarians.)  05-Apr-14 04:59   Glucose, Serum  180  BUN  24  Creatinine (comp)  6.25  Sodium, Serum  132  06-Apr-14 04:42   Glucose, Serum  124  BUN  35  Creatinine (comp)  8.59  Sodium, Serum  131  07-Apr-14 14:16   Glucose, Serum  131  BUN  47  Creatinine (comp)  11.38  Sodium, Serum  128  Routine Hem:  02-Apr-14 15:22   WBC (CBC)  14.9  RBC (CBC)  3.04  Hemoglobin (CBC)  9.7  Hematocrit (CBC)  30.1  03-Apr-14 05:09   WBC (CBC)  12.3  RBC (CBC)  3.05  Hemoglobin (CBC)  9.7  Hematocrit (CBC)  30.1  04-Apr-14 05:22   WBC (CBC)  14.3  RBC (CBC)  2.86  Hemoglobin (CBC)  9.3  Hematocrit (CBC)  28.3  05-Apr-14 04:59   WBC (CBC)  15.2  RBC (CBC)  2.88  Hemoglobin (CBC)  9.2  Hematocrit (CBC)  28.2  06-Apr-14 04:42   WBC (CBC)  15.4  RBC (CBC)  2.75  Hemoglobin (CBC)  8.9  Hematocrit (CBC)  27.2  07-Apr-14 14:16   WBC (CBC)  12.0  RBC (CBC)  2.79  Hemoglobin (CBC)  8.9  Hematocrit (  CBC)  27.3  08-Apr-14 04:59   WBC (CBC) 9.8  RBC (CBC)  2.79  Hemoglobin (CBC)  9.0  Hematocrit (CBC)  27.3   PERTINENT RADIOLOGY STUDIES: LabUnknown:    02-Apr-14 12:01, Misc Aerobic/Anaerobic Cult./Smear  CEFOXITIN S  CEFOXITIN S    03-Apr-14 11:03, Wound Aerobic/Anaerobic Culture  CEFOXITIN S  CEFOXITIN R  CEFOXITIN S   Hospital Course:  Hospital Course Pt underwent surgery Friday April 4 By Dr. Vickki Muff for I and D infected right great toe joint and resection of infected right tibial sesamoid.  Area was packed with stimulan absorbable antibiiotic beads and has remained stable.  Cellulitis and swelling has improved and WBC is back down to normal levels.  Blood sugars have remained.   Condition on Discharge Good    DISCHARGE INSTRUCTIONS HOME MEDS:  Medication Reconciliation: Patient's Home Medications at Discharge:     Medication Instructions  rena-vite    once a day with meal   actos 30 mg oral tablet  1 tab(s) orally once a day   renatabs    once a day   nexium 40 mg oral delayed release capsule  1 cap(s) orally once a day   aspirin 81 mg oral tablet  1 tab(s) orally once a day   docusate sodium sodium 50 mg oral capsule  1 dose(s) orally once a day, As Needed   pravastatin 40 mg oral tablet  1 tab(s) orally once a day (at bedtime)   renvela 800 mg oral tablet  4 tab(s) orally 3 times a day   glipizide 5 mg oral tablet  1  orally 3 times a day (with meals)   acetaminophen-oxycodone 325 mg-5 mg oral tablet  1 tab(s) orally every 4 to 6 hours, As needed, pain   amoxicillin-clavulanate  500 milligram(s) orally every 24 hours   levofloxacin 500 mg oral tablet  1 tab(s) orally once a day   metoprolol succinate 50 mg oral tablet, extended release  1 tab(s) orally once a day   ondansetron  4 milligram(s) injectable every 4 hours, As needed, nausea, vomiting     Physician's Instructions:  Treatments Physical therapy at rehab   Commodore A dry bandage has been applied to your wound.  Keep this bandage clean and dry.  change dressing to right foot daily   Diet Carbohydrate Controlled (ADA) Diet   Activity Limitations bathroom priveleges .  Must wear orthowedge shoe when standing or ambulatiing   Time frame for Follow Up Appointment 1-2 weeks  one week to see Dr. Vickki Muff   Electronic Signatures: Elvina Mattes, Gerrit Heck (MD)  (Signed 08-Apr-14 13:50)  Authored: ADMISSION DATE AND DIAGNOSIS, CHIEF COMPLAINT/HPI, PERTINENT LABS, [place], PATIENT INSTRUCTIONS   Last Updated: 08-Apr-14 13:50 by Perry Mount (MD)

## 2014-08-31 NOTE — Consult Note (Signed)
PATIENT NAME:  Lauren Mcintosh, Lauren Mcintosh MR#:  [id number] DATE OF BIRTH:  [birth year]  DATE OF CONSULTATION:  08/10/2012  REQUESTING PHYSICIAN:  Dr. Ether GriffinsFowler   CONSULTING PHYSICIAN:  Venezia Sargeant Lizabeth LeydenN. Braylen Denunzio, MD  REASON FOR CONSULTATION:  Evaluation and management of end-stage renal disease in a hemodialysis patient.   HISTORY OF PRESENT ILLNESS: The patient is a very pleasant 59 year old African American female with past medical history of diabetes mellitus, hypertension, diabetic nephropathy, end-stage renal disease on hemodialysis Monday, Wednesday and Friday, Barrett's esophagus, right-sided colectomy for high-grade tubular adenoma in May 2009, anemia of chronic kidney disease, secondary hyperparathyroidism, diverticulosis, right foot ulceration who presented to [facility] for further care of right diabetic foot ulcer. The patient was followed by Dr. Gwyneth RevelsJustin Fowler for this issue. She will need additional antibiotic and surgical therapy to fully treat these ulcerations. In regards to her underlying endstage renal disease, the patient has been on dialysis for the past 3.5 years. [facility]. The cause of her renal failure is diabetic nephropathy. She dialyzes on the first shift per her report. She has secondary hyperparathyroidism for which she takes Renvela 4 tablets with meals and 2 tablets with snacks. She also has anemia of chronic kidney disease treated with Epogen, most recent hemoglobin was found to be 9.7. The patient has a left upper extremity fistula, which is functional. She underwent dialysis today and completed her full therapy.   PAST MEDICAL HISTORY: 1.  Diabetes mellitus.  2.  Hypertension.  3.  Diabetic nephropathy.  4. End-stage renal disease on hemodialysis Monday, Wednesday, Friday [facility].  5.  History of Barrett's esophagus.  6.  Right hemicolectomy for high-grade tubular adenoma in May 2009.  7.   Anemia of chronic kidney disease.  8.  Secondary hyperparathyroidism.  9.  Diverticulosis.  10.  Left upper extremity AV fistula.   ALLERGIES:  No known drug allergies.  CURRENT INPATIENT MEDICATIONS:  Include: 1.  Tylox 5/325 one to 2 tablets p.o. q. 4 hours p.r.n. pain.  2.  Sliding scale insulin.  3.  Toprol-XL 50 mg p.o. daily.  4.  Prilosec 20 mg p.o. b.i.d.  5.  Actos 30 mg p.o. daily.  6.  Zosyn 3.375 grams IV q. 12 hours.  7.  Pravastatin 40 mg p.o. at bedtime.  8.  Sevelamer 3200 mg p.o. t.i.d. with meals.   SOCIAL HISTORY:  The [place] in [place]. She has 1 daughter. She denies tobacco, alcohol or illicit drug use.   FAMILY HISTORY:  The patient's mother is living and has hypertension. The patient's father is deceased and had history of end-stage renal disease requiring hemodialysis.  REVIEW OF SYSTEMS:  CONSTITUTIONAL:  Denies fevers. Reports some intermittent chills.  EYES:  Denies diplopia, blurry vision.  HEENT:  Denies headaches or hearing loss. Denies epistaxis.  CARDIOVASCULAR:  Denies chest pain, palpitations, PND.  RESPIRATORY:  Denies cough, shortness of breath, hemoptysis.  GASTROINTESTINAL:  Denies nausea, vomiting, bloody stools.  GENITOURINARY:  Denies frequency, urgency, dysuria.  MUSCULOSKELETAL:  Reports right foot diabetic foot ulcer.  INTEGUMENTARY:  Denies skin rashes. Reports ulceration in the right foot.  NEUROLOGIC:  Reports numbness and tingling in bilateral feet.  PSYCHIATRIC:  Denies depression, bipolar disorder.  ENDOCRINE: Denies polyuria or polydipsia. Does have history of diabetes mellitus. HEMATOLOGIC AND LYMPHATIC: Denies easy bruisability, bleeding or swollen lymph nodes. Has history of anemia of chronic kidney disease. ALLERGY AND IMMUNOLOGIC:  Denies seasonal allergies or history of immunodeficiency.   PHYSICAL EXAMINATION: VITAL SIGNS: Temperature 99.9, pulse 97, respiration 18, blood pressure 127/74, pulse oximetry 98%  on room air.  GENERAL:  Well-developed, well-nourished African American female who appears her stated age, currently in no acute distress.  HEENT:  Normocephalic, atraumatic. Extraocular movements are intact. Pupils equal, round and reactive to light. No scleral icterus. Conjunctivae are pink. No epistaxis noted. Gross hearing intact. Oral mucosa moist.  NECK:  Supple without JVD or lymphadenopathy.  LUNGS:  Clear to auscultation bilaterally with normal respiratory effort.  HEART:  S1, S2 regular rate and rhythm. No murmurs, rubs or gallops appreciated.  ABDOMEN:  Obese, soft, nontender, nondistended. Bowel sounds positive.  No rebound.  No guarding. No gross organomegaly appreciated.  EXTREMITIES:  No clubbing, cyanosis or edema.  NEUROLOGIC: The patient is alert and oriented to time, person and place. Strength is 5/5 in both upper and lower extremities.  SKIN:  Warm and dry. No rashes noted.  MUSCULOSKELETAL:  The patient's right foot is wrapped. The dressing was not taken down as wounds were evaluated in the right foot by Dr. Ether GriffinsFowler today.  PSYCHIATRIC:  The patient has appropriate affect and appears to have good insight into her current illness. HEMODIALYSIS ACCESS:  The patient has a left upper extremity fistula with good bruit and thrill.   LABORATORY DATA:  Sodium 130, potassium 3.9, chloride 95, CO2 23, BUN 19, creatinine 4.26, glucose 367. CBC shows WBC 14.9, hemoglobin 9.7, hematocrit 30, platelets 267.   IMPRESSION:  This is a 59 year old African American female with past medical history of diabetes mellitus, hypertension, diabetic nephropathy, end-stage renal disease on hemodialysis Monday, Wednesday, Friday [facility], right colectomy for high-grade tubular adenoma May 2009, anemia of chronic disease, secondary hyperparathyroidism, diverticulosis who [facility] with diabetic ulcer in the right foot.    PLAN: 1. End-stage renal disease on hemodialysis Monday, Wednesday, Friday. [facility]. She has been undergoing dialysis quite well. She tolerated her dialysis session today. Therefore, at this point in time, no indication for dialysis tomorrow. We will plan for the next dialysis treatment on Friday. We will continue to use her left upper extremity AV fistula.  2. Right diabetic foot ulcer. The patient is being monitored by Dr. Ether GriffinsFowler for this. The patient has been started on Zosyn 3.375 grams IV q. 12 hours. We would also recommend consideration of MRSA coverage. Therefore, vancomycin could be considered; however, I defer this to Dr. Ether GriffinsFowler.  3. Secondary hyperparathyroidism.  The patient reports to me that she takes 3200 mg of Renvela t.i.d. with meals. However, she also takes 2400 mg with snacks. We will add this to her medication regimen.  4. Anemia of chronic kidney disease.  The patient's hemoglobin is a bit low at present at 9.7. She may be slightly Epogen resistant given the acute infection now. However, we will continue the patient on Epogen 10,000 units IV every dialysis treatment. We will determine her outpatient Epogen dosing as well. 5. I would like to thank Dr. Ether GriffinsFowler for this kind referral. Further plans as the patient progresses.     ____________________________ Lennox PippinsMunsoor N. Giovana Faciane, MD mnl:ce D: 08/10/2012 17:07:00 ET T: 08/10/2012 17:27:08 ET JOB#: [id number]  cc: Lennox PippinsMunsoor N. Jazzie Trampe, MD, <Dictator> Ria CommentMUNSOOR N Taequan Stockhausen MD ELECTRONICALLY SIGNED 08/12/2012 11:35

## 2014-08-31 NOTE — Consult Note (Signed)
Impression:    59yo female w/ h/o DM and ESRD on HD admitted with right foot abscess.    The intraoperative note indicates that the metatarsal bone was intact without evidence for osteomyelitis.  The sesimoid bone was involved and removed, but as this bone is not articulated with other bones, infection of the sesimoid may not have the same issues as the other bones of the foot.    Would continue current broad spectrum antibiotics until the cultures return.    Will adjust antibiotics after the cultures return. 4)     Will follow her CBC.  Electronic Signatures: Ethen Bannan MPH, Rosalyn GessMichael E (MD) (Signed on 04-Apr-14 15:53)  Authored   Last Updated: 04-Apr-14 15:59 by Deshane Cotroneo MPH, Rosalyn GessMichael E (MD)
